# Patient Record
Sex: Female | Born: 1975 | Race: White | Hispanic: No | Marital: Single | Smoking: Current some day smoker
Health system: Southern US, Community
[De-identification: ages and names within clinical notes are randomized; demographics above are authoritative.]

## PROBLEM LIST (undated history)

## (undated) DIAGNOSIS — I10 Essential (primary) hypertension: Secondary | ICD-10-CM

---

## 2017-09-04 ENCOUNTER — Other Ambulatory Visit: Payer: Self-pay

## 2017-09-04 ENCOUNTER — Emergency Department
Admission: EM | Admit: 2017-09-04 | Discharge: 2017-09-04 | Disposition: A | Discharge status: Home / Self Care | Payer: Self-pay | Attending: Student in an Organized Health Care Education/Training Program | Admitting: Student in an Organized Health Care Education/Training Program

## 2017-09-04 ENCOUNTER — Emergency Department: Payer: Self-pay

## 2017-09-04 DIAGNOSIS — Z79899 Other long term (current) drug therapy: Secondary | ICD-10-CM | POA: Insufficient documentation

## 2017-09-04 DIAGNOSIS — N3000 Acute cystitis without hematuria: Secondary | ICD-10-CM | POA: Insufficient documentation

## 2017-09-04 DIAGNOSIS — Z91010 Allergy to peanuts: Secondary | ICD-10-CM | POA: Insufficient documentation

## 2017-09-04 DIAGNOSIS — F17200 Nicotine dependence, unspecified, uncomplicated: Secondary | ICD-10-CM | POA: Insufficient documentation

## 2017-09-04 DIAGNOSIS — I10 Essential (primary) hypertension: Secondary | ICD-10-CM | POA: Insufficient documentation

## 2017-09-04 DIAGNOSIS — M5116 Intervertebral disc disorders with radiculopathy, lumbar region: Secondary | ICD-10-CM | POA: Insufficient documentation

## 2017-09-04 DIAGNOSIS — M79605 Pain in left leg: Secondary | ICD-10-CM

## 2017-09-04 HISTORY — DX: Essential (primary) hypertension: I10

## 2017-09-04 LAB — URINALYSIS, COMPLETE (UACMP) WITH MICROSCOPIC
BILIRUBIN URINE: NEGATIVE
Glucose, UA: NEGATIVE mg/dL
HGB URINE DIPSTICK: NEGATIVE
KETONES UR: NEGATIVE mg/dL
LEUKOCYTES UA: NEGATIVE
NITRITE: NEGATIVE
PROTEIN: NEGATIVE mg/dL
SPECIFIC GRAVITY, URINE: 1.019 (ref 1.005–1.030)
pH: 6 (ref 5.0–8.0)

## 2017-09-04 MED ORDER — NAPROXEN 500 MG PO TABS
500.0000 mg | ORAL_TABLET | Freq: Once | ORAL | Status: AC
  Administered 2017-09-04: 500 mg via ORAL

## 2017-09-04 MED ORDER — LORAZEPAM 2 MG/ML IJ SOLN
1.0000 mg | Freq: Once | INTRAMUSCULAR | Status: AC
  Administered 2017-09-04: 1 mg via INTRAVENOUS
  Filled 2017-09-04: qty 1

## 2017-09-04 MED ORDER — NITROFURANTOIN MONOHYD MACRO 100 MG PO CAPS: 100.0000 mg | ORAL_CAPSULE | Freq: Two times a day (BID) | ORAL | 0 refills | Status: AC

## 2017-09-04 MED ORDER — PREDNISONE 10 MG PO TABS: 10.0000 mg | ORAL_TABLET | Freq: Every day | ORAL | 0 refills | Status: DC

## 2017-09-04 MED ORDER — GABAPENTIN 300 MG PO CAPS: 300.0000 mg | ORAL_CAPSULE | Freq: Two times a day (BID) | ORAL | 0 refills | Status: AC

## 2017-09-04 MED ORDER — NAPROXEN 500 MG PO TABS
ORAL_TABLET | ORAL | Status: AC
  Filled 2017-09-04: qty 1

## 2017-09-04 MED ORDER — FLUCONAZOLE 150 MG PO TABS: 150.0000 mg | ORAL_TABLET | Freq: Every day | ORAL | 0 refills | Status: AC

## 2017-09-04 MED ORDER — HYDROCODONE-ACETAMINOPHEN 5-325 MG PO TABS
1.0000 | ORAL_TABLET | Freq: Once | ORAL | Status: AC
  Administered 2017-09-04: 1 via ORAL
  Filled 2017-09-04: qty 1

## 2017-09-04 MED ORDER — ONDANSETRON 4 MG PO TBDP
4.0000 mg | ORAL_TABLET | Freq: Once | ORAL | Status: AC
  Administered 2017-09-04: 4 mg via ORAL
  Filled 2017-09-04: qty 1

## 2017-09-04 MED ORDER — PREDNISONE 20 MG PO TABS
60.0000 mg | ORAL_TABLET | Freq: Once | ORAL | Status: AC
  Administered 2017-09-04: 60 mg via ORAL
  Filled 2017-09-04: qty 3

## 2017-09-04 NOTE — ED Notes (Signed)
Pt laying in bed, asks this RN to feel feet, left foot cool to touch, right foot warm. Pedal pulses present bilateral feet.

## 2017-09-04 NOTE — ED Notes (Signed)
Pt in bed, speaking to MRI tech on this RN phone at this time.  Requested pain medicine.

## 2017-09-04 NOTE — ED Notes (Signed)
Pt resting in bed, denies need for pain med at this time.

## 2017-09-04 NOTE — ED Provider Notes (Signed)
[facility] Emergency Department Provider Note    First MD Initiated Contact with Patient 09/04/17 1255     (approximate)  I have reviewed the triage vital signs and the nursing notes.   HISTORY  Chief Complaint Leg Pain    HPI Hannah Pittman is a 42 y.o. female with a history of high blood pressure as well as "herniated disc "in her low back presents to the ER for evaluation of left-sided leg weakness and numbness with shooting pain that started on Saturday.  Denies any heavy lifting but was exercising with her dog.  Has not lost any control of her bladder or bowels.  Denies any saddle anesthesia.  Is never had weakness or tingling like this before.  States it is getting more difficult for her to walk.  Denies any fevers.  Past Medical History:  Diagnosis Date  . Hypertension    No family history on file. Past Surgical History:  Procedure Laterality Date  . CESAREAN SECTION     There are no active problems to display for this patient.     Prior to Admission medications   Medication Sig Start Date End Date Taking? Authorizing Provider  amphetamine-dextroamphetamine (ADDERALL) 30 MG tablet Take 1 tablet by mouth 2 (two) times daily. 11/07/14  Yes [provider]  ibuprofen (ADVIL,MOTRIN) 200 MG tablet Take 800 mg by mouth every 6 (six) hours as needed for moderate pain.   Yes [provider]  lamoTRIgine (LAMICTAL) 100 MG tablet Take 150 mg by mouth at bedtime.  05/15/14  Yes [provider]  pantoprazole (PROTONIX) 40 MG tablet Take 1 tablet by mouth daily as needed (heart burn).  10/10/16  Yes [provider]  valACYclovir (VALTREX) 1000 MG tablet Take 1 tablet by mouth daily as needed (cold sores).  06/19/14  Yes [provider]  amLODipine (NORVASC) 2.5 MG tablet Take 1 tablet by mouth daily. 10/10/16   [provider]  fluconazole (DIFLUCAN) 150 MG tablet Take 1 tablet (150 mg total) by mouth daily.  09/04/17   Willy Eddy, MD  gabapentin (NEURONTIN) 300 MG capsule Take 1 capsule (300 mg total) by mouth 2 (two) times daily. 09/04/17   Willy Eddy, MD  lisinopril (PRINIVIL,ZESTRIL) 20 MG tablet Take 1 tablet by mouth daily. 10/10/16   [provider]  nitrofurantoin, macrocrystal-monohydrate, (MACROBID) 100 MG capsule Take 1 capsule (100 mg total) by mouth 2 (two) times daily for 3 days. 09/04/17 09/07/17  Willy Eddy, MD  predniSONE (DELTASONE) 10 MG tablet Take 1 tablet (10 mg total) by mouth daily. Day 1-2: Take 50 mg  ( 5 pills) Day 3-4 : Take 40 mg (4pills) Day 5-6: Take 30 mg (3 pills) Day 7-8:  Take 20 mg (2 pills) Day 9:  Take  (1 pill) 09/04/17   Willy Eddy, MD    Allergies Basil oil and Peanut-containing drug products    Social History Social History   Tobacco Use  . Smoking status: Current Some Day Smoker  . Smokeless tobacco: Never Used  Substance Use Topics  . Alcohol use: Not Currently  . Drug use: Not Currently    Review of Systems Patient denies headaches, rhinorrhea, blurry vision, numbness, shortness of breath, chest pain, edema, cough, abdominal pain, nausea, vomiting, diarrhea, dysuria, fevers, rashes or hallucinations unless otherwise stated above in HPI. ____________________________________________   PHYSICAL EXAM:  VITAL SIGNS: Vitals:   09/04/17 1719 09/04/17 1833  BP: 138/85 (!) 148/99  Pulse: 78 80  Resp:  Temp:    SpO2: 98% 98%    Constitutional: Alert and oriented. Well appearing and in no acute distress. Eyes: Conjunctivae are normal.  Head: Atraumatic. Nose: No congestion/rhinnorhea. Mouth/Throat: Mucous membranes are moist.   Neck: No stridor. Painless ROM.  Cardiovascular: Normal rate, regular rhythm. Grossly normal heart sounds.  Good peripheral circulation. Respiratory: Normal respiratory effort.  No retractions. Lungs CTAB. Gastrointestinal: Soft and nontender. No distention. No abdominal bruits. No  CVA tenderness. Genitourinary:  Musculoskeletal: No lower extremity tenderness nor edema.  No joint effusions. Neurologic: CN- intact.  No facial droop, Normal FNF.  Normal heel to shin.  Sensation decreased subjectively in LLE to foot and posterior calf, patellar and achilles DTR absent bilaterally, down going babinski Normal speech and language. No gross focal neurologic deficits are appreciated. No gait instability. Skin:  Skin is warm, dry and intact. No rash noted. Psychiatric: Mood and affect are normal. Speech and behavior are normal.  ____________________________________________   LABS (all labs ordered are listed, but only abnormal results are displayed)  Results for orders placed or performed during the hospital encounter of 09/04/17 (from the past 24 hour(s))  Urinalysis, Complete w Microscopic     Status: Abnormal   Collection Time: 09/04/17  1:54 PM  Result Value Ref Range   Color, Urine YELLOW (A) YELLOW   APPearance CLEAR (A) CLEAR   Specific Gravity, Urine 1.019 1.005 - 1.030   pH 6.0 5.0 - 8.0   Glucose, UA NEGATIVE NEGATIVE mg/dL   Hgb urine dipstick NEGATIVE NEGATIVE   Bilirubin Urine NEGATIVE NEGATIVE   Ketones, ur NEGATIVE NEGATIVE mg/dL   Protein, ur NEGATIVE NEGATIVE mg/dL   Nitrite NEGATIVE NEGATIVE   Leukocytes, UA NEGATIVE NEGATIVE   RBC / HPF 0-5 0 - 5 RBC/hpf   WBC, UA 0-5 0 - 5 WBC/hpf   Bacteria, UA FEW (A) NONE SEEN   Squamous Epithelial / LPF 0-5 0 - 5   Mucus PRESENT    ____________________________________________   ____________________________________________  RADIOLOGY  I personally reviewed all radiographic images ordered to evaluate for the above acute complaints and reviewed radiology reports and findings.  These findings were personally discussed with the patient.  Please see medical record for radiology report.  ____________________________________________   PROCEDURES  Procedure(s) performed:  Procedures    Critical Care  performed: no ____________________________________________   INITIAL IMPRESSION / ASSESSMENT AND PLAN / ED COURSE  Pertinent labs & imaging results that were available during my care of the patient were reviewed by me and considered in my medical decision making (see chart for details).  DDX: radiculopathy, cauda equina, spinal stenosis, sciatica, stone  Hannah Pittman is a 42 y.o. who presents to the ED with  leg pain and back pain as described above.  She has good peripheral perfusion with strong triphasic Doppler signals.  Not clinically consistent with claudication or ischemic limb.  Not clinically consistent with dissection.  Does have some paresthesias in report of weakness in left leg therefore based on history of herniated disc MRI of the lumbar spine ordered to evaluate for the above differential.    Clinical Course as of Sep 04 2009  Fri Sep 04, 2017  1703 Discussed case with patient.  Observe her walking she does have a stable gait.  Describing merrily paresthesias.  She does have sensations intact to light touch.  No observable weakness.   [PR]    Clinical Course User Index [PR] Willy Eddy, MD   MRI does show evidence of S1  nerve root impingement.  No cauda equina.  Patient is able to ambulate no significant motor weakness at this time.  Spoke with Dr. Gearlean Alf of neurosurgery at [facility] agrees no indication for emergent surgical decompression based on paucity of symptoms at this time.  Most clinically consistent with sciatica and radiculopathy.  Will treat with steroids as well as pain medication and arrange outpatient follow-up.  Have discussed with the patient and available family all diagnostics and treatments performed thus far and all questions were answered to the best of my ability. The patient demonstrates understanding and agreement with plan.   As part of my medical decision making, I reviewed the following data within the electronic MEDICAL RECORD NUMBER Nursing  notes reviewed and incorporated, Labs reviewed, notes from prior ED visits.  ____________________________________________   FINAL CLINICAL IMPRESSION(S) / ED DIAGNOSES  Final diagnoses:  Left leg pain  Sciatica of left side due to displacement of lumbar intervertebral disc  Acute cystitis without hematuria      NEW MEDICATIONS STARTED DURING THIS VISIT:  New Prescriptions   FLUCONAZOLE (DIFLUCAN) 150 MG TABLET    Take 1 tablet (150 mg total) by mouth daily.   GABAPENTIN (NEURONTIN) 300 MG CAPSULE    Take 1 capsule (300 mg total) by mouth 2 (two) times daily.   NITROFURANTOIN, MACROCRYSTAL-MONOHYDRATE, (MACROBID) 100 MG CAPSULE    Take 1 capsule (100 mg total) by mouth 2 (two) times daily for 3 days.   PREDNISONE (DELTASONE) 10 MG TABLET    Take 1 tablet (10 mg total) by mouth daily. Day 1-2: Take 50 mg  ( 5 pills) Day 3-4 : Take 40 mg (4pills) Day 5-6: Take 30 mg (3 pills) Day 7-8:  Take 20 mg (2 pills) Day 9:  Take  (1 pill)     Note:  This document was prepared using Dragon voice recognition software and may include unintentional dictation errors.    Willy Eddy, MD 09/04/17 2011

## 2017-09-04 NOTE — ED Notes (Signed)
Pt changed into gown, hooked up to pulse ox and bp cuff, Dr Roxan Hockey at bedside. Popliteal and pedal pulse palpated and positive with pedal doppler. Pt appears in NAD.

## 2017-09-04 NOTE — ED Triage Notes (Addendum)
Pt c/o pain that started 6 days ago in the posterior left leg and states it has gotten worse with numbness and cool to tough per pt.  Color is symmetrical with right leg, pedal pulses present and strong.

## 2017-09-04 NOTE — ED Notes (Signed)
Pt alert and oriented, sitting upright in stretcher in NAD, VSS, pt reporting pain 7/10 in left leg.

## 2017-09-04 NOTE — ED Notes (Signed)
Pt. Verbalizes understanding of d/c instructions, medications, and follow-up. VS stable.  Pt. In NAD at time of d/c and denies further concerns regarding this visit. Pt. Stable at the time of departure from the unit, departing unit by the safest and most appropriate manner per that pt condition and limitations with all belongings accounted for. Pt advised to return to the ED at any time for emergent concerns, or for new/worsening symptoms.   

## 2017-09-04 NOTE — Discharge Instructions (Addendum)

## 2017-11-09 ENCOUNTER — Encounter: Payer: Self-pay | Admitting: Emergency Medicine

## 2017-11-09 ENCOUNTER — Emergency Department
Admission: EM | Admit: 2017-11-09 | Discharge: 2017-11-09 | Disposition: A | Discharge status: Home / Self Care | Payer: Self-pay | Attending: Emergency Medicine | Admitting: Emergency Medicine

## 2017-11-09 DIAGNOSIS — M5416 Radiculopathy, lumbar region: Secondary | ICD-10-CM | POA: Insufficient documentation

## 2017-11-09 DIAGNOSIS — M5126 Other intervertebral disc displacement, lumbar region: Secondary | ICD-10-CM | POA: Insufficient documentation

## 2017-11-09 DIAGNOSIS — F17210 Nicotine dependence, cigarettes, uncomplicated: Secondary | ICD-10-CM | POA: Insufficient documentation

## 2017-11-09 DIAGNOSIS — Z91010 Allergy to peanuts: Secondary | ICD-10-CM | POA: Insufficient documentation

## 2017-11-09 DIAGNOSIS — I10 Essential (primary) hypertension: Secondary | ICD-10-CM | POA: Insufficient documentation

## 2017-11-09 DIAGNOSIS — Z79899 Other long term (current) drug therapy: Secondary | ICD-10-CM | POA: Insufficient documentation

## 2017-11-09 MED ORDER — OXYCODONE-ACETAMINOPHEN 5-325 MG PO TABS
1.0000 | ORAL_TABLET | Freq: Once | ORAL | Status: AC
  Administered 2017-11-09: 1 via ORAL
  Filled 2017-11-09: qty 1

## 2017-11-09 MED ORDER — PREDNISONE 20 MG PO TABS
60.0000 mg | ORAL_TABLET | Freq: Once | ORAL | Status: AC
  Administered 2017-11-09: 60 mg via ORAL
  Filled 2017-11-09: qty 3

## 2017-11-09 MED ORDER — ORPHENADRINE CITRATE 30 MG/ML IJ SOLN
60.0000 mg | INTRAMUSCULAR | Status: AC
  Administered 2017-11-09: 60 mg via INTRAVENOUS
  Filled 2017-11-09: qty 2

## 2017-11-09 MED ORDER — HYDROMORPHONE HCL 1 MG/ML IJ SOLN: 1.0000 mg | Freq: Once | INTRAMUSCULAR | Status: DC

## 2017-11-09 MED ORDER — KETOROLAC TROMETHAMINE 10 MG PO TABS: 10.0000 mg | ORAL_TABLET | Freq: Three times a day (TID) | ORAL | 0 refills | Status: AC

## 2017-11-09 MED ORDER — GABAPENTIN 300 MG PO CAPS: 300.0000 mg | ORAL_CAPSULE | Freq: Two times a day (BID) | ORAL | 1 refills | Status: AC

## 2017-11-09 MED ORDER — ORPHENADRINE CITRATE 30 MG/ML IJ SOLN: 60.0000 mg | INTRAMUSCULAR | Status: DC

## 2017-11-09 MED ORDER — CYCLOBENZAPRINE HCL 5 MG PO TABS: 5.0000 mg | ORAL_TABLET | Freq: Three times a day (TID) | ORAL | 0 refills | Status: AC | PRN

## 2017-11-09 MED ORDER — KETOROLAC TROMETHAMINE 30 MG/ML IJ SOLN: 30.0000 mg | Freq: Once | INTRAMUSCULAR | Status: DC

## 2017-11-09 MED ORDER — HYDROCODONE-ACETAMINOPHEN 5-325 MG PO TABS: 1.0000 | ORAL_TABLET | Freq: Four times a day (QID) | ORAL | 0 refills | Status: AC | PRN

## 2017-11-09 MED ORDER — PREDNISONE 20 MG PO TABS: 20.0000 mg | ORAL_TABLET | Freq: Every day | ORAL | 0 refills | Status: AC

## 2017-11-09 MED ORDER — GABAPENTIN 600 MG PO TABS: 600.0000 mg | ORAL_TABLET | Freq: Once | ORAL | Status: DC

## 2017-11-09 MED ORDER — KETOROLAC TROMETHAMINE 30 MG/ML IJ SOLN
30.0000 mg | Freq: Once | INTRAMUSCULAR | Status: AC
  Administered 2017-11-09: 30 mg via INTRAVENOUS
  Filled 2017-11-09: qty 1

## 2017-11-09 NOTE — ED Triage Notes (Signed)
Patient presents to the ED with exacerbation of chronic back pain.  Patient states, "I have a herniated disc."  Patient appears uncomfortable at this time.

## 2017-11-09 NOTE — ED Notes (Signed)
pt

## 2017-11-09 NOTE — ED Provider Notes (Signed)
[facility] Emergency Department Provider Note ____________________________________________  Time seen: 1920  I have reviewed the triage vital signs and the nursing notes.  HISTORY  Chief Complaint  Back Pain  HPI Hannah Pittman is a [age] y.o. female presents to the ED accompanied by her daughter for evaluation of chronic low back pain with left lower extremity referral.  Patient with a confirmed L5-S1 HNP from an MRI performed in May, presents with ongoing pain.  She has not followed up with neurology or neurosurgery as previously referred.  Review of the chart notes that attempts have been made by the clinic to contact the patient.  She also reports she is uninsured, so further management is also financial burden on her.  She denies any recent injury, accident, trauma patient also denies any saddle anesthesia, bladder or bowel incontinence, or footdrop.  She does report left lower extremity pain and weakness that has been persistent since May and even beyond that; but has been increased this week. According to the patient, she has been 'crawling around the house' all week. She has been taking ibuprofen and Motrin without significant benefit to her symptoms.  She has previously been prescribed gabapentin and steroids.  Past Medical History:  Diagnosis Date  . Hypertension     There are no active problems to display for this patient.   Past Surgical History:  Procedure Laterality Date  . CESAREAN SECTION      Prior to Admission medications   Medication Sig Start Date End Date Taking? Authorizing Provider  amLODipine (NORVASC) 2.5 MG tablet Take 1 tablet by mouth daily. 10/10/16   [provider]  amphetamine-dextroamphetamine (ADDERALL) 30 MG tablet Take 1 tablet by mouth 2 (two) times daily. 11/07/14   [provider]  cyclobenzaprine (FLEXERIL) 5 MG tablet Take 1 tablet (5 mg total) by mouth 3 (three) times daily as needed for muscle spasms.  11/09/17   Cinzia Devos, Charlesetta IvoryJenise V Bacon, PA-C  fluconazole (DIFLUCAN) 150 MG tablet Take 1 tablet (150 mg total) by mouth daily. 09/04/17   Willy Eddyobinson, Patrick, MD  gabapentin (NEURONTIN) 300 MG capsule Take 1 capsule (300 mg total) by mouth 2 (two) times daily. 09/04/17   Willy Eddyobinson, Patrick, MD  gabapentin (NEURONTIN) 300 MG capsule Take 1 capsule (300 mg total) by mouth 2 (two) times daily. 11/09/17 01/08/18  Earland Reish, Charlesetta IvoryJenise V Bacon, PA-C  HYDROcodone-acetaminophen (NORCO) 5-325 MG tablet Take 1 tablet by mouth every 6 (six) hours as needed. 11/09/17   Kimo Bancroft, Charlesetta IvoryJenise V Bacon, PA-C  ibuprofen (ADVIL,MOTRIN) 200 MG tablet Take 800 mg by mouth every 6 (six) hours as needed for moderate pain.    [provider]  ketorolac (TORADOL) 10 MG tablet Take 1 tablet (10 mg total) by mouth every 8 (eight) hours. 11/09/17   Endya Austin, Charlesetta IvoryJenise V Bacon, PA-C  lamoTRIgine (LAMICTAL) 100 MG tablet Take 150 mg by mouth at bedtime.  05/15/14   [provider]  lisinopril (PRINIVIL,ZESTRIL) 20 MG tablet Take 1 tablet by mouth daily. 10/10/16   [provider]  pantoprazole (PROTONIX) 40 MG tablet Take 1 tablet by mouth daily as needed (heart burn).  10/10/16   [provider]  predniSONE (DELTASONE) 20 MG tablet Take 1 tablet (20 mg total) by mouth daily with breakfast for 7 days. 11/09/17 11/16/17  Dorene Bruni, Charlesetta IvoryJenise V Bacon, PA-C  valACYclovir (VALTREX) 1000 MG tablet Take 1 tablet by mouth daily as needed (cold sores).  06/19/14   [provider]    Allergies Domingo CockingBasil  oil and Peanut-containing drug products  No family history on file.  Social History Social History   Tobacco Use  . Smoking status: Current Some Day Smoker  . Smokeless tobacco: Never Used  Substance Use Topics  . Alcohol use: Not Currently  . Drug use: Not Currently    Review of Systems  Constitutional: Negative for fever. Cardiovascular: Negative for chest pain. Respiratory: Negative for shortness of breath. Gastrointestinal:  Negative for abdominal pain, vomiting and diarrhea. Genitourinary: Negative for dysuria or incontinence. Musculoskeletal: Positive for back pain with LLE referral. Skin: Negative for rash. Neurological: Negative for headaches, focal weakness or numbness. ____________________________________________  PHYSICAL EXAM:  VITAL SIGNS: ED Triage Vitals [11/09/17 1844]  Enc Vitals Group     BP (!) 158/96     Pulse Rate 80     Resp 18     Temp 98.5 F (36.9 C)     Temp Source Oral     SpO2 96 %     Weight 240 lb (108.9 kg)     Height 5\' 6"  (1.676 m)     Head Circumference      Peak Flow      Pain Score 10     Pain Loc      Pain Edu?      Excl. in GC?     Constitutional: Alert and oriented. Well appearing and in no distress. Head: Normocephalic and atraumatic. Cardiovascular: Normal rate, regular rhythm. Normal distal pulses. Respiratory: Normal respiratory effort. No wheezes/rales/rhonchi. Gastrointestinal: Soft and nontender. No distention. Musculoskeletal: Normal spinal alignment without midline tenderness, spasm, deformity, or step-off. Nontender with normal range of motion in all extremities.  Neurologic:  Mildly antalgic gait without ataxia. Negative supine SLR bilaterally. 2+ patella DTRs bilaterally. Absent left ankle reflex, 2+ on the right. Normal toe dorsiflexion and foot eversion. Normal speech and language. No gross focal neurologic deficits are appreciated. Skin:  Skin is warm, dry and intact. No rash noted. Psychiatric: Mood and affect are anxious and agitated. Patient exhibits appropriate insight and judgment. ____________________________________________   RADIOLOGY  Lumbar MRI (09/04/17)  IMPRESSION: 1. Left subarticular disc protrusion at L5-S1 with secondary left S1 nerve root impingement. 2. Additional mild disc bulging at L3-4 and L4-5 with resultant mild to moderate bilateral lateral recess stenosis as  above. ____________________________________________  PROCEDURES  Procedures Toradol 30 mg IVP Norflex 60 mg IVP Percocet 5-325 mg PO Deltasone 60 mg PO ____________________________________________  INITIAL IMPRESSION / ASSESSMENT AND PLAN / ED COURSE  ----------------------------------------- 11:46 PM on 11/09/2017 ----------------------------------------- Patient prepared for discharge when Beth, NT informed me that the patient was unwilling to discharge due to her complaints of increased LLE pain. The patient had previously ambulated to the restroom, without difficulty. I discussed with the patient, as I had prior to treatment, that her diagnosis was a radiculopathy (confirmed by MRI), and definitive management must come from neurology.   She was given a single dose of hydromorphone prior to her discharge. Her daughter went ahead to get prescriptions from the local pharmacy, and will return to pick up the patient. She was discharged with prescriptions for prednisone, ketorolac, gabapentin, and cyclobenzaprine. She is encouraged to either follow-up with [facility] for consultation, or seek charity care assistance through [facility] or [facility] systems.  I reviewed the patient's prescription history over the last 12 months in the multi-state controlled substances database(s) that includes [place], [place], [place], [place], [place], [place], [place], [place], [place], [place], [place], [place], [place], and [place].  Results were notable for no narcotic prescriptions.  ____________________________________________  FINAL CLINICAL IMPRESSION(S) / ED DIAGNOSES  Final diagnoses:  HNP (herniated nucleus pulposus), lumbar  Lumbar radiculopathy      Leannah Guse, Charlesetta Ivory, PA-C 11/10/17 0001    Phineas Semen, MD 11/11/17 [phone]

## 2017-11-09 NOTE — ED Notes (Signed)
[  pt ambulatory to restroom with steady gait noted

## 2017-11-09 NOTE — Discharge Instructions (Addendum)
Your exam is consistent with lumbar radiculopathy. Take the prescription meds as directed. Follow-up with your provider or neurologist for further consultation and management.

## 2017-11-10 NOTE — Progress Notes (Signed)
Pt was discharged and daughter went to bring the car around.  Before the NT could take her out in the wheelchair, she was crying and saying, "Something is wrong if I'm still hurting after having all of these medications."  The PA went to the room to reassess and ordered an additional administration of IM dilaudid.  Prior to administering it, the pt was standing outside her room and told the NT her daughter was back from the pharmacy and she'd like to be wheeled out.  The NT put her in a wheelchair and took her out to her daughter.  Per PA, prior discharge orders and paperwork are still applicable without changes.

## 2017-11-11 NOTE — ED Notes (Signed)
11/11/2017  1145--patient called and says she does not have the prednisone rx.  Says her daughter [facility] instruction [facility] and [facility].  They told her the prednisone rx was not included.  I told her I could call the prednisone in for her.  She says she would like it called to [facility] [place] as it is closer. I called it to [facility].

## 2019-10-28 IMAGING — MR MR LUMBAR SPINE W/O CM
5 series · 40 of 48 positions shown · non-contrast
Comparison: None available.

CLINICAL DATA: Initial evaluation for low back pain with acute left
leg pain for 6 days.

EXAM:
MRI LUMBAR SPINE WITHOUT CONTRAST
TECHNIQUE: Multiplanar, multisequence MR imaging of the lumbar spine was
performed. No intravenous contrast was administered.

[Series 2: T2 · sagittal · 4.0mm · 1.02mm/px · 6 of 17 slices shown (1 of 2)]
[im 1/17]
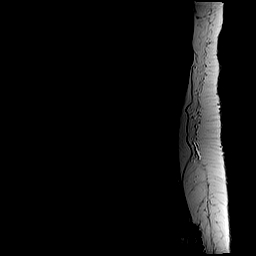
[im 4/17]
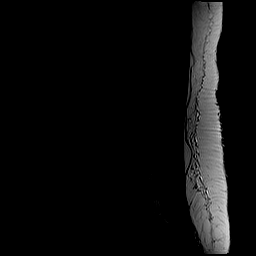
[im 7/17]
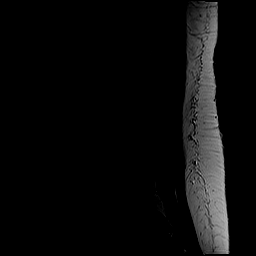
[im 10/17]
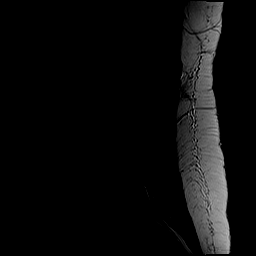
[im 13/17]
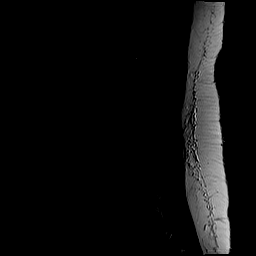
[im 17/17]
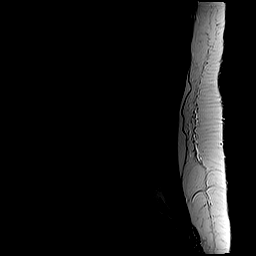

[Series 3: T1 · sagittal · 4.0mm · 1.02mm/px · 6 of 17 slices shown (1 of 2)]
[im 1/17]
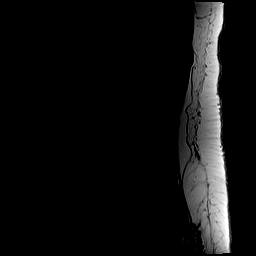
[im 4/17]
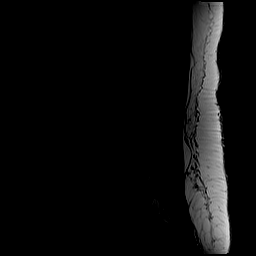
[im 7/17]
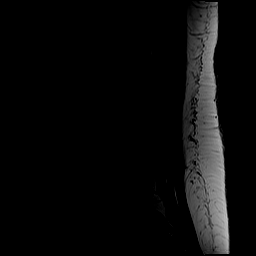
[im 10/17]
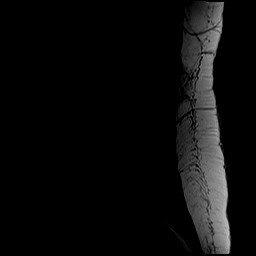
[im 13/17]
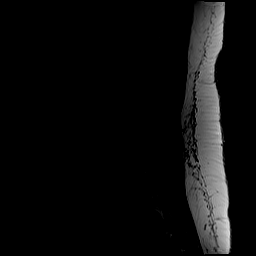
[im 17/17]
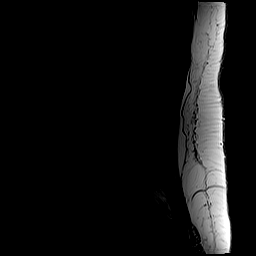

[Series 4: STIR · sagittal · 4.0mm · 1.02mm/px · 6 of 17 slices shown]
[im 1/17]
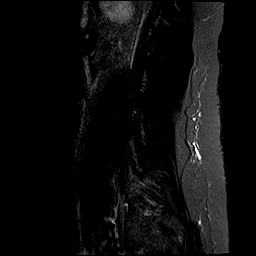
[im 4/17]
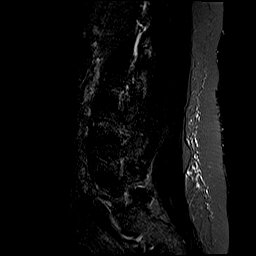
[im 7/17]
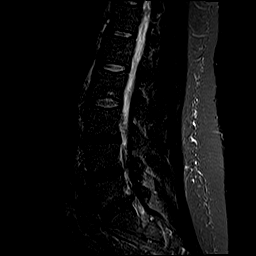
[im 10/17]
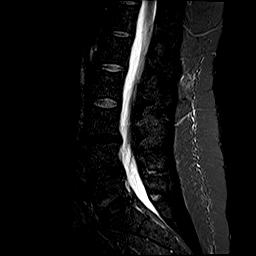
[im 13/17]
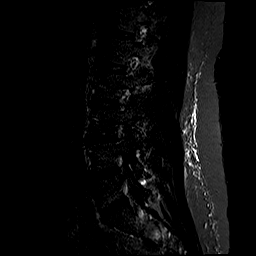
[im 17/17]
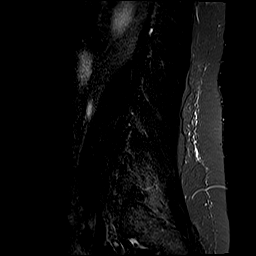

[Series 5: T2 · axial · 4.0mm · 0.78mm/px · z∈[-128,+108]mm · 13 of 43 slices shown (2 of 2)]
[im 1/43]
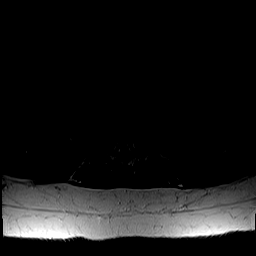
[im 4/43]
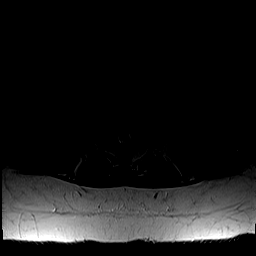
[im 7/43]
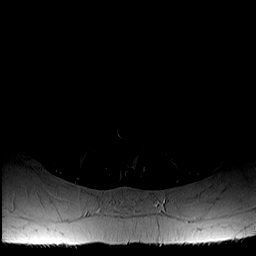
[im 10/43]
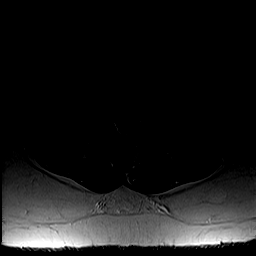
[im 13/43]
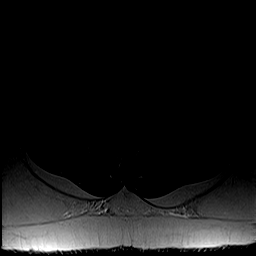
[im 16/43]
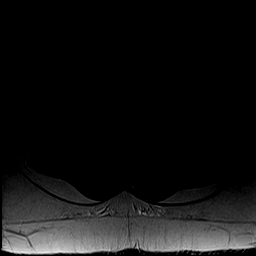
[im 19/43]
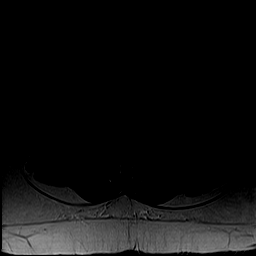
[im 22/43]
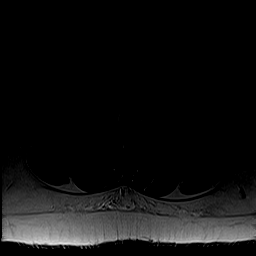
[im 25/43]
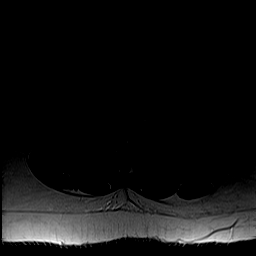
[im 28/43]
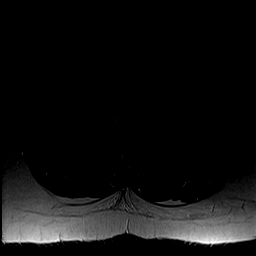
[im 31/43]
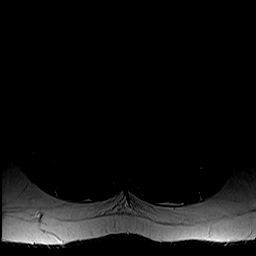
[im 37/43]
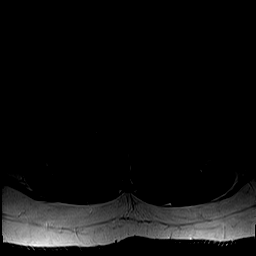
[im 43/43]
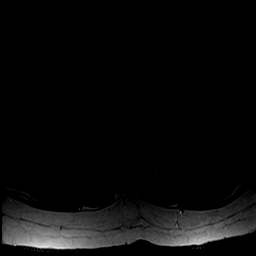

[Series 6: T1 · axial · 4.0mm · 0.39mm/px · z∈[-128,+108]mm · 9 of 43 slices shown (2 of 2)]
[im 1/43]
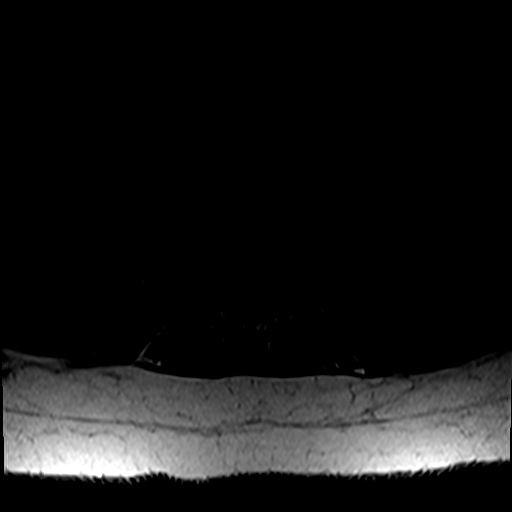
[im 7/43]
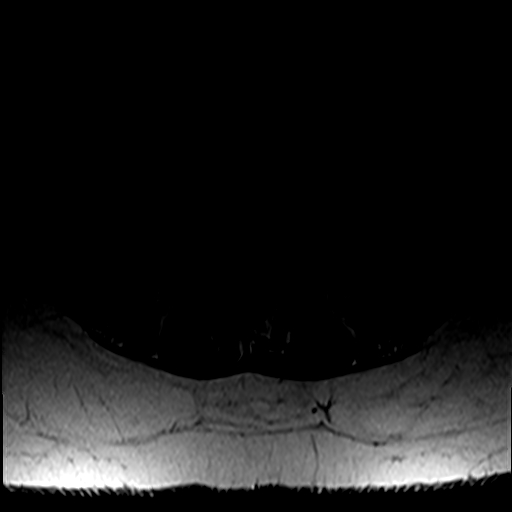
[im 13/43]
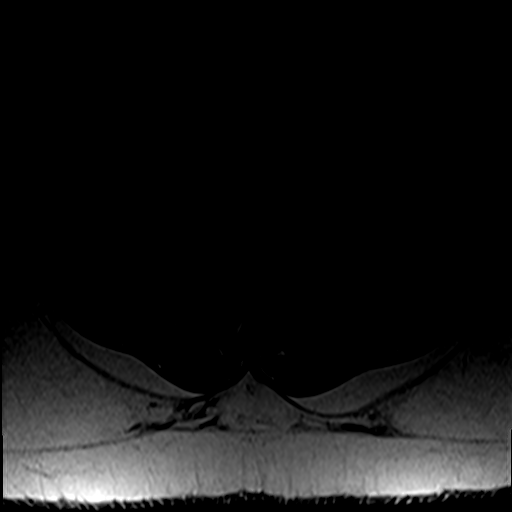
[im 19/43]
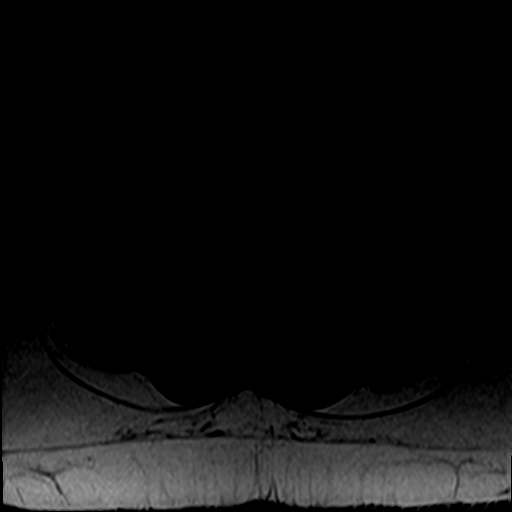
[im 22/43]
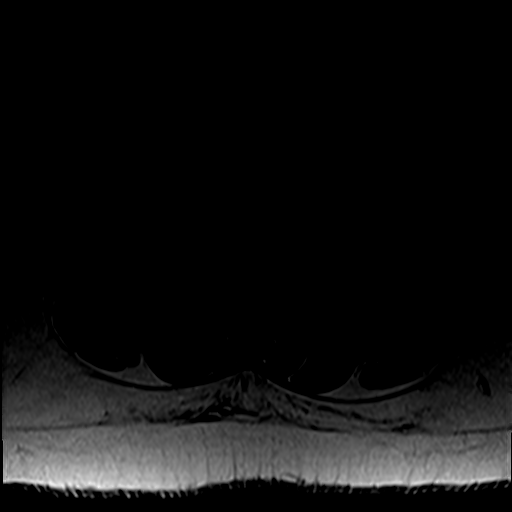
[im 25/43]
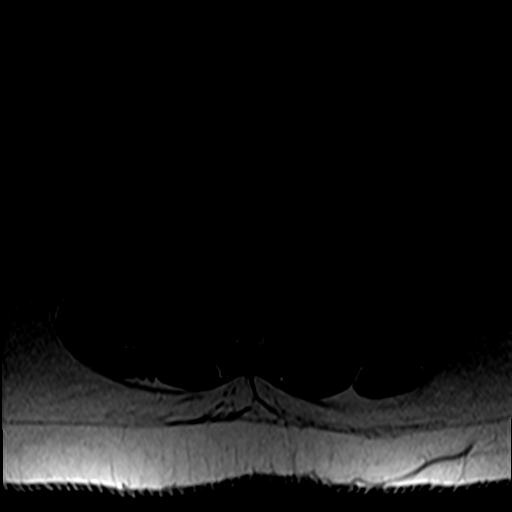
[im 31/43]
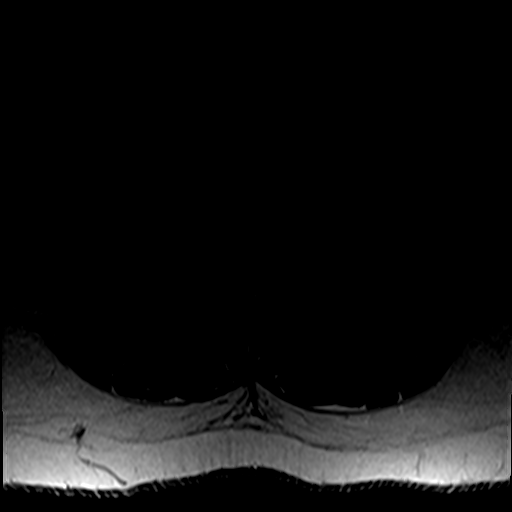
[im 37/43]
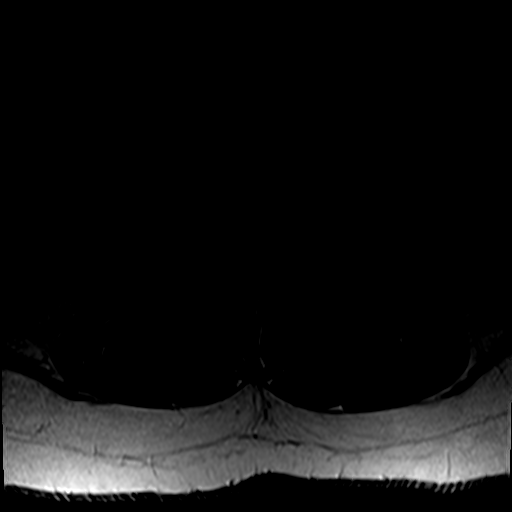
[im 43/43]
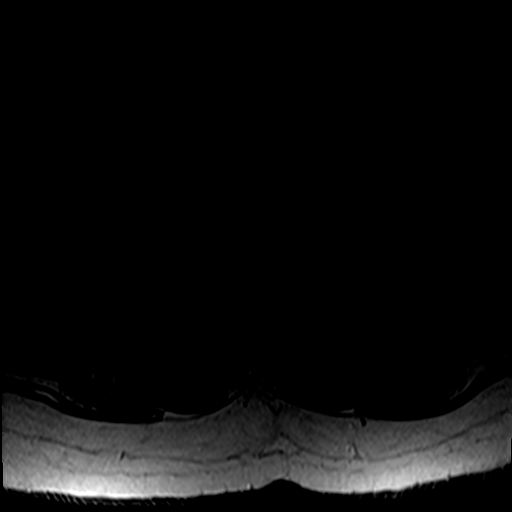

[40 of 48 positions shown; findings below may reference images not displayed]

FINDINGS: Segmentation: Normal segmentation. Lowest well-formed disc labeled
the L5-S1 level.

Alignment: Vertebral bodies normally aligned with preservation of
the normal lumbar lordosis. No listhesis.

Vertebrae: Vertebral body heights are well maintained without
evidence for acute or chronic fracture. Bone marrow signal intensity
within normal limits. Chronic reactive endplate changes present
about the L5-S1 interspace. 16 mm benign hemangioma noted within the
central aspect of the L4 vertebral body. Additional probable 14 mm
hemangioma noted within the S2 segment. No other discrete or
worrisome osseous lesions.

Conus medullaris and cauda equina: Conus extends to the L1 level.
Conus and cauda equina appear normal.

Paraspinal and other soft tissues: Paraspinous soft tissues
demonstrate no acute abnormality. Multiple simple T2 hyperintense
cyst noted within the left kidney. Visualized visceral structures
otherwise unremarkable.

Disc levels:

L1-2:  Unremarkable.

L2-3:  Unremarkable.

L3-4: Diffuse disc bulge with intervertebral disc space narrowing.
Superimposed small central disc protrusion, slightly eccentric to
the right. Resultant mild canal with mild to moderate bilateral
lateral recess narrowing, slightly worse on the right. Foramina
remain widely patent.

L4-5: Diffuse disc bulge. Superimposed tiny central disc protrusion
mildly indents the ventral thecal sac. Resultant mild left lateral
recess narrowing without significant spinal stenosis. Foramina
remain patent.

L5-S1: Disc desiccation with chronic intervertebral disc space
narrowing. Superimposed left subarticular disc protrusion impinges
upon the descending left S1 nerve root in the left lateral recess
(series 6, image 37). Foramina remain patent.
IMPRESSION: 1. Left subarticular disc protrusion at L5-S1 with secondary left S1
nerve root impingement.
2. Additional mild disc bulging at L3-4 and L4-5 with resultant mild
to moderate bilateral lateral recess stenosis as above.
